# Patient Record
Sex: Male | Born: 1970 | Race: Black or African American | Hispanic: No | Marital: Married | Smoking: Never smoker
Health system: Southern US, Community
[De-identification: ages and names within clinical notes are randomized; demographics above are authoritative.]

## PROBLEM LIST (undated history)

## (undated) ENCOUNTER — Ambulatory Visit (HOSPITAL_COMMUNITY): Admission: EM | Payer: BLUE CROSS/BLUE SHIELD | Source: Home / Self Care

## (undated) DIAGNOSIS — B1920 Unspecified viral hepatitis C without hepatic coma: Secondary | ICD-10-CM

## (undated) DIAGNOSIS — I10 Essential (primary) hypertension: Secondary | ICD-10-CM

## (undated) DIAGNOSIS — B360 Pityriasis versicolor: Secondary | ICD-10-CM

## (undated) HISTORY — DX: Pityriasis versicolor: B36.0

---

## 2000-04-10 ENCOUNTER — Encounter: Payer: Self-pay | Admitting: General Practice

## 2000-04-10 ENCOUNTER — Encounter: Admission: RE | Admit: 2000-04-10 | Discharge: 2000-04-10 | Payer: Self-pay | Admitting: General Practice

## 2004-02-10 ENCOUNTER — Emergency Department (HOSPITAL_COMMUNITY): Admission: EM | Admit: 2004-02-10 | Discharge: 2004-02-10 | Payer: Self-pay

## 2009-10-13 ENCOUNTER — Emergency Department (HOSPITAL_COMMUNITY): Admission: EM | Admit: 2009-10-13 | Discharge: 2009-10-13 | Payer: Self-pay | Admitting: Emergency Medicine

## 2011-05-20 ENCOUNTER — Ambulatory Visit: Payer: Self-pay | Admitting: Physician Assistant

## 2011-05-20 VITALS — BP 111/78 | HR 62 | Temp 97.8°F | Resp 16 | Ht 72.75 in | Wt 192.8 lb

## 2011-05-20 DIAGNOSIS — IMO0001 Reserved for inherently not codable concepts without codable children: Secondary | ICD-10-CM

## 2011-05-20 DIAGNOSIS — R030 Elevated blood-pressure reading, without diagnosis of hypertension: Secondary | ICD-10-CM

## 2011-05-20 DIAGNOSIS — B360 Pityriasis versicolor: Secondary | ICD-10-CM

## 2011-05-20 DIAGNOSIS — R51 Headache: Secondary | ICD-10-CM

## 2011-05-20 DIAGNOSIS — B1910 Unspecified viral hepatitis B without hepatic coma: Secondary | ICD-10-CM

## 2011-05-20 DIAGNOSIS — G44209 Tension-type headache, unspecified, not intractable: Secondary | ICD-10-CM

## 2011-05-20 MED ORDER — MELOXICAM 15 MG PO TABS: 15.0000 mg | ORAL_TABLET | Freq: Every day | ORAL | Status: DC

## 2011-05-20 MED ORDER — KETOCONAZOLE 200 MG PO TABS: 200.0000 mg | ORAL_TABLET | Freq: Every day | ORAL | Status: DC

## 2011-05-20 NOTE — Patient Instructions (Signed)
Increase your water intake to 64 ounces each day.  Increase your sleep to 6-8 hours each day, even if it's in parts.  If your headaches continue, return for additional evaluation.

## 2011-05-21 ENCOUNTER — Encounter: Payer: Self-pay | Admitting: Physician Assistant

## 2011-05-21 DIAGNOSIS — B360 Pityriasis versicolor: Secondary | ICD-10-CM | POA: Insufficient documentation

## 2011-05-21 DIAGNOSIS — B1910 Unspecified viral hepatitis B without hepatic coma: Secondary | ICD-10-CM | POA: Insufficient documentation

## 2011-05-21 NOTE — Progress Notes (Signed)
  Subjective:    Patient ID: Bryan Marsh, male    DOB: [birth year], 41 y.o.   MRN: [id number]  HPI This patient presents complaining of HA for 3-4 days.  He describes the pain as a dull ache.  No associated symptoms:  Dizziness, vision change, nausea/vomiting, paresthesias, weakness.  He's been working long hours and going to school.  Most nights only getting 3-4 hours of sleep, often less.  He also complains of recurrent tinea versicolor every time he re-starts his exercise regimen.  He requests an Rx for ketoconazole in advance of his next efforts.  Tinea versicolor episodes are well documented in his chart.  He occasionally has pain in the left elbow.  Pain on the bottom of the right foot after wearing a new pair of shoes last week.  He has tried no treatments for his HA or foot or elbow pain.   Review of Systems As above.  No F/C.  No rash.      Objective:   Physical Exam  VS noted.  WDWNBM, awake, alert and oriented, NAD.  Sclera and conjunctiva are clear.  Fundi are normal.  TMs, nasal mucosa and OP are clear. Cranial nerves II-XII are intact.  Neck is supple, nontender without lymphadenopathy or thyromegaly.  Heart has a RRR without murmurs.  Lungs CTA.  Peripheral pulses are symmetric.  Skin is warm and dry.  Increased callous noted on the left elbow.  Non-tender.  Good strength in the upper and lower extremities.  DTRs are symmetric.  Sensation is intact.  Coordination is normal.       Assessment & Plan:  HA. Likely due to sleep deprivation.  Discussed ways to increase his sleep.  Meloxicam trial.  If symptoms worsen, persist or change, RTC.  Tinea Versicolor.  Ketoconazole refill.  Foot pain.  Meloxicam will likely help, resume wearing alternate shoes.  Elevated BP.  Monitor outside the office.  If consistently >140/90, start medication.

## 2011-06-22 ENCOUNTER — Ambulatory Visit: Payer: Self-pay | Admitting: Emergency Medicine

## 2011-06-22 ENCOUNTER — Encounter: Payer: Self-pay | Admitting: Emergency Medicine

## 2011-06-22 ENCOUNTER — Ambulatory Visit: Payer: Self-pay

## 2011-06-22 VITALS — BP 147/82 | HR 106 | Temp 101.3°F | Resp 16 | Ht 72.0 in | Wt 192.6 lb

## 2011-06-22 DIAGNOSIS — Z202 Contact with and (suspected) exposure to infections with a predominantly sexual mode of transmission: Secondary | ICD-10-CM

## 2011-06-22 DIAGNOSIS — IMO0001 Reserved for inherently not codable concepts without codable children: Secondary | ICD-10-CM

## 2011-06-22 DIAGNOSIS — R05 Cough: Secondary | ICD-10-CM

## 2011-06-22 DIAGNOSIS — R059 Cough, unspecified: Secondary | ICD-10-CM

## 2011-06-22 DIAGNOSIS — R5381 Other malaise: Secondary | ICD-10-CM

## 2011-06-22 DIAGNOSIS — R5383 Other fatigue: Secondary | ICD-10-CM

## 2011-06-22 DIAGNOSIS — R300 Dysuria: Secondary | ICD-10-CM

## 2011-06-22 DIAGNOSIS — R509 Fever, unspecified: Secondary | ICD-10-CM

## 2011-06-22 DIAGNOSIS — R52 Pain, unspecified: Secondary | ICD-10-CM

## 2011-06-22 LAB — POCT CBC
Granulocyte percent: 58.1 %G (ref 37–80)
HCT, POC: 44.9 % (ref 43.5–53.7)
Hemoglobin: 14.3 g/dL (ref 14.1–18.1)
Lymph, poc: 2.1 (ref 0.6–3.4)
MCH, POC: 29.9 pg (ref 27–31.2)
MCHC: 31.8 g/dL (ref 31.8–35.4)
MCV: 94 fL (ref 80–97)
MID (cbc): 0.7 (ref 0–0.9)
MPV: 10.7 fL (ref 0–99.8)
POC Granulocyte: 3.8 (ref 2–6.9)
POC LYMPH PERCENT: 31.6 %L (ref 10–50)
POC MID %: 10.3 %M (ref 0–12)
Platelet Count, POC: 155 10*3/uL (ref 142–424)
RBC: 4.78 M/uL (ref 4.69–6.13)
RDW, POC: 14.6 %
WBC: 6.5 10*3/uL (ref 4.6–10.2)

## 2011-06-22 LAB — POCT URINALYSIS DIPSTICK
Bilirubin, UA: NEGATIVE
Glucose, UA: NEGATIVE
Leukocytes, UA: NEGATIVE
Nitrite, UA: NEGATIVE
Protein, UA: 30
Spec Grav, UA: 1.025
Urobilinogen, UA: 0.2
pH, UA: 5.5

## 2011-06-22 LAB — POCT UA - MICROSCOPIC ONLY
Bacteria, U Microscopic: NEGATIVE
Casts, Ur, LPF, POC: NEGATIVE
Crystals, Ur, HPF, POC: NEGATIVE
Mucus, UA: NEGATIVE
Yeast, UA: NEGATIVE

## 2011-06-22 LAB — POCT INFLUENZA A/B
Influenza A, POC: NEGATIVE
Influenza B, POC: NEGATIVE

## 2011-06-22 MED ORDER — ACETAMINOPHEN 500 MG PO TABS
1000.0000 mg | ORAL_TABLET | Freq: Once | ORAL | Status: AC
  Administered 2011-06-22: 1000 mg via ORAL

## 2011-06-22 MED ORDER — METHOCARBAMOL 750 MG PO TABS: 750.0000 mg | ORAL_TABLET | Freq: Four times a day (QID) | ORAL | Status: AC
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
## 2011-06-23 LAB — GC/CHLAMYDIA PROBE AMP, GENITAL
Chlamydia, DNA Probe: NEGATIVE
GC Probe Amp, Genital: NEGATIVE

## 2011-06-24 ENCOUNTER — Telehealth: Payer: Self-pay

## 2014-02-16 ENCOUNTER — Encounter (HOSPITAL_COMMUNITY): Payer: Self-pay | Admitting: Emergency Medicine

## 2014-02-16 ENCOUNTER — Emergency Department (HOSPITAL_COMMUNITY)
Admission: EM | Admit: 2014-02-16 | Discharge: 2014-02-17 | Disposition: A | Discharge status: Home / Self Care | Payer: Self-pay | Attending: Emergency Medicine | Admitting: Emergency Medicine

## 2014-02-16 DIAGNOSIS — Z8619 Personal history of other infectious and parasitic diseases: Secondary | ICD-10-CM | POA: Insufficient documentation

## 2014-02-16 DIAGNOSIS — R112 Nausea with vomiting, unspecified: Secondary | ICD-10-CM

## 2014-02-16 DIAGNOSIS — R1084 Generalized abdominal pain: Secondary | ICD-10-CM

## 2014-02-16 HISTORY — DX: Unspecified viral hepatitis C without hepatic coma: B19.20

## 2014-02-16 LAB — COMPREHENSIVE METABOLIC PANEL
ALT: 22 U/L (ref 0–53)
AST: 20 U/L (ref 0–37)
Albumin: 3.7 g/dL (ref 3.5–5.2)
Alkaline Phosphatase: 48 U/L (ref 39–117)
Anion gap: 14 (ref 5–15)
BUN: 14 mg/dL (ref 6–23)
CALCIUM: 9.6 mg/dL (ref 8.4–10.5)
CO2: 24 meq/L (ref 19–32)
Chloride: 104 mEq/L (ref 96–112)
Creatinine, Ser: 0.87 mg/dL (ref 0.50–1.35)
GFR calc Af Amer: >90 mL/min (ref >90)
Glucose, Bld: 101 mg/dL — ABNORMAL HIGH (ref 70–99)
POTASSIUM: 4.2 meq/L (ref 3.7–5.3)
SODIUM: 142 meq/L (ref 137–147)
Total Bilirubin: <0.2 mg/dL — ABNORMAL LOW (ref 0.3–1.2)
Total Protein: 7.3 g/dL (ref 6.0–8.3)

## 2014-02-16 LAB — CBC WITH DIFFERENTIAL/PLATELET
BASOS ABS: 0 10*3/uL (ref 0.0–0.1)
Basophils Relative: 0 % (ref 0–1)
EOS PCT: 1 % (ref 0–5)
Eosinophils Absolute: 0.1 10*3/uL (ref 0.0–0.7)
HCT: 38.6 % — ABNORMAL LOW (ref 39.0–52.0)
Hemoglobin: 12.9 g/dL — ABNORMAL LOW (ref 13.0–17.0)
LYMPHS PCT: 43 % (ref 12–46)
Lymphs Abs: 3.1 10*3/uL (ref 0.7–4.0)
MCH: 30.2 pg (ref 26.0–34.0)
MCHC: 33.4 g/dL (ref 30.0–36.0)
MCV: 90.4 fL (ref 78.0–100.0)
Monocytes Absolute: 0.5 10*3/uL (ref 0.1–1.0)
Monocytes Relative: 7 % (ref 3–12)
NEUTROS ABS: 3.4 10*3/uL (ref 1.7–7.7)
NEUTROS PCT: 49 % (ref 43–77)
PLATELETS: 181 10*3/uL (ref 150–400)
RBC: 4.27 MIL/uL (ref 4.22–5.81)
RDW: 14.1 % (ref 11.5–15.5)
WBC: 7.1 10*3/uL (ref 4.0–10.5)

## 2014-02-16 LAB — LIPASE, BLOOD: Lipase: 19 U/L (ref 11–59)

## 2014-02-16 MED ORDER — IOHEXOL 300 MG/ML  SOLN: 25.0000 mL | Freq: Once | INTRAMUSCULAR | Status: DC | PRN

## 2014-02-16 MED ORDER — MORPHINE SULFATE 4 MG/ML IJ SOLN
4.0000 mg | Freq: Once | INTRAMUSCULAR | Status: AC
Start: 2014-02-16 — End: 2014-02-16
  Administered 2014-02-16: 4 mg via INTRAVENOUS
  Filled 2014-02-16: qty 1

## 2014-02-16 MED ORDER — ONDANSETRON HCL 4 MG/2ML IJ SOLN
4.0000 mg | Freq: Once | INTRAMUSCULAR | Status: AC
  Administered 2014-02-16: 4 mg via INTRAVENOUS
  Filled 2014-02-16: qty 2

## 2014-02-16 MED ORDER — SODIUM CHLORIDE 0.9 % IV BOLUS (SEPSIS)
1000.0000 mL | Freq: Once | INTRAVENOUS | Status: AC
  Administered 2014-02-16: 1000 mL via INTRAVENOUS

## 2014-02-16 MED ORDER — IOHEXOL 300 MG/ML  SOLN
25.0000 mL | Freq: Once | INTRAMUSCULAR | Status: AC | PRN
  Administered 2014-02-16: 25 mL via ORAL

## 2014-02-16 NOTE — ED Provider Notes (Signed)
CSN: [id number]     Arrival date & time 02/16/14  1955 History   First MD Initiated Contact with Patient 02/16/14 2229     Chief Complaint  Patient presents with  . Abdominal Pain   Bryan Marsh is a 43 y.o. male with history of hepatitis C who presents to the ED complaining of abdominal pain, nausea and vomiting after eating expired cut cantaloupe earlier today. Reports that he accidentally ingested some expired cut cantaloupe around 6:30 PM today. He reports that he started vomiting about an hour after he ingested. She reports his abdominal pain began after he started vomiting. He reports he had 3 episodes of diarrhea today. He reports he is feeling nauseated currently. He rates his pain at 7/10 sharp. He has attempted no treatments today. He denies previous abdominal surgeries. The patient denies fevers, chills, hematemesis, hematuria, hematochezia, chest pain, or shortness of breath.   (Consider location/radiation/quality/duration/timing/severity/associated sxs/prior Treatment) HPI  Past Medical History  Diagnosis Date  . Tinea versicolor   . Hepatitis C    History reviewed. No pertinent past surgical history. No family history on file. History  Substance Use Topics  . Smoking status: Never Smoker   . Smokeless tobacco: Never Used  . Alcohol Use: No    Review of Systems  Constitutional: Negative for fever and chills.  HENT: Negative for congestion, ear pain, sore throat and trouble swallowing.   Eyes: Negative for pain and visual disturbance.  Respiratory: Negative for cough, shortness of breath and wheezing.   Cardiovascular: Negative for chest pain and palpitations.  Gastrointestinal: Positive for nausea, vomiting, abdominal pain and diarrhea. Negative for blood in stool.  Genitourinary: Negative for urgency, frequency, hematuria, flank pain and difficulty urinating.  Musculoskeletal: Negative for back pain and neck pain.  Skin: Negative for rash.  Neurological: Negative  for dizziness, weakness, light-headedness and headaches.  All other systems reviewed and are negative.     Allergies  Review of patient's allergies indicates no known allergies.  Home Medications   Prior to Admission medications   Medication Sig Start Date End Date Taking? Authorizing Provider  acetaminophen (TYLENOL) 325 MG tablet Take 650 mg by mouth every 6 (six) hours as needed for mild pain.   Yes Historical Provider, MD  Multiple Vitamin (MULTIVITAMIN) tablet Take 1 tablet by mouth daily.   Yes Historical Provider, MD   BP 141/81 mmHg  Pulse 70  Temp(Src) 97.9 F (36.6 C)  Resp 18  Ht 6\' 2"  (1.88 m)  Wt 190 lb (86.183 kg)  BMI 24.38 kg/m2  SpO2 94% Physical Exam  Constitutional: He appears well-developed and well-nourished. No distress.  HENT:  Head: Normocephalic and atraumatic.  Mouth/Throat: Oropharynx is clear and moist.  Eyes: Conjunctivae are normal. Pupils are equal, round, and reactive to light. Right eye exhibits no discharge. Left eye exhibits no discharge.  Neck: Neck supple.  Cardiovascular: Normal rate, regular rhythm, normal heart sounds and intact distal pulses.  Exam reveals no gallop and no friction rub.   No murmur heard. Pulmonary/Chest: Effort normal and breath sounds normal. No respiratory distress. He has no wheezes. He has no rales.  Abdominal: Soft. Bowel sounds are normal. He exhibits no distension and no mass. There is tenderness. There is rebound and guarding.  Abdomen is soft. Patient has some mild right lower quadrant tenderness. Patient has moderate left lower quadrant tenderness. Positive obturator sign. Positive psoas sign. Negative rebound tenderness. Negative Murphy sign.  Musculoskeletal: He exhibits no edema.  Lymphadenopathy:  He has no cervical adenopathy.  Neurological: He is alert. Coordination normal.  Skin: Skin is warm and dry. No rash noted. He is not diaphoretic. No erythema. No pallor.  Psychiatric: He has a normal mood  and affect. His behavior is normal.  Nursing note and vitals reviewed.   ED Course  Procedures (including critical care time) Labs Review Labs Reviewed  CBC WITH DIFFERENTIAL - Abnormal; Notable for the following:    Hemoglobin 12.9 (*)    HCT 38.6 (*)    All other components within normal limits  COMPREHENSIVE METABOLIC PANEL - Abnormal; Notable for the following:    Glucose, Bld 101 (*)    Total Bilirubin <0.2 (*)    All other components within normal limits  LIPASE, BLOOD  URINALYSIS, ROUTINE W REFLEX MICROSCOPIC    Imaging Review No results found.   EKG Interpretation None      Filed Vitals:   02/16/14 2330 02/17/14 0000 02/17/14 0100 02/17/14 0115  BP: 137/90 130/90 139/97 141/81  Pulse: 63 77 74 70  Temp:      TempSrc:   Oral   Resp:   18   Height:      Weight:      SpO2: 97% 95% 96% 94%     MDM   Meds given in ED:  Medications  sodium chloride 0.9 % bolus 1,000 mL (1,000 mLs Intravenous New Bag/Given 02/16/14 2339)  morphine 4 MG/ML injection 4 mg (4 mg Intravenous Given 02/16/14 2342)  ondansetron (ZOFRAN) injection 4 mg (4 mg Intravenous Given 02/16/14 2340)  iohexol (OMNIPAQUE) 300 MG/ML solution 25 mL (25 mLs Oral Contrast Given 02/16/14 2357)  iohexol (OMNIPAQUE) 300 MG/ML solution 100 mL (100 mLs Intravenous Contrast Given 02/17/14 0126)    New Prescriptions   No medications on file    Final diagnoses:  Generalized abdominal pain  Non-intractable vomiting with nausea, vomiting of unspecified type   Bryan Marsh is a 43 y.o. male with history of hepatitis C who presents to the ED complaining of abdominal pain, nausea and vomiting after eating expired cut cantaloupe earlier today.  12:45 am Patient is resting in bed. The patient reports his pain has decreased to a 6 out of 10. The patient reports his nausea has resolved. He reports he has not had any vomiting or diarrhea since getting into the room. I advised him that we are still waiting for  his CT scan at this time. The patient verbalized understanding with plan. 2:02 AM patient is sleeping in bed. I had to awake patient to speak with him. Patient reports his abdominal pain is now a 5 out of 10 and is decreasing. Still waiting for CT scan results. Advised patient to follow up with his PCP. Patient provided resource list.   2:30 AM patient care handed off to Dr. Loretha StaplerWofford who will review CT scan and make disposition.   Patient discussed with Dr. Loretha StaplerWofford who agrees with assessment and plan.      Lawana ChambersWilliam Duncan Ersel Enslin, PA-C 02/17/14 [id number]  Warnell Foresterrey Wofford, MD 02/17/14 [phone]

## 2014-02-16 NOTE — ED Notes (Signed)
Pt. reports mid abdominal pain with emesis and diarrhea after eating cantaloupe this evening . Denies fever or chills.

## 2014-02-17 ENCOUNTER — Emergency Department (HOSPITAL_COMMUNITY): Payer: Self-pay

## 2014-02-17 ENCOUNTER — Encounter (HOSPITAL_COMMUNITY): Payer: Self-pay

## 2014-02-17 LAB — URINALYSIS, ROUTINE W REFLEX MICROSCOPIC
Bilirubin Urine: NEGATIVE
Glucose, UA: NEGATIVE mg/dL
HGB URINE DIPSTICK: NEGATIVE
Ketones, ur: NEGATIVE mg/dL
LEUKOCYTES UA: NEGATIVE
NITRITE: NEGATIVE
PROTEIN: NEGATIVE mg/dL
SPECIFIC GRAVITY, URINE: 1.009 (ref 1.005–1.030)
UROBILINOGEN UA: 0.2 mg/dL (ref 0.0–1.0)
pH: 5.5 (ref 5.0–8.0)

## 2014-02-17 MED ORDER — IOHEXOL 300 MG/ML  SOLN
100.0000 mL | Freq: Once | INTRAMUSCULAR | Status: AC | PRN
  Administered 2014-02-17: 100 mL via INTRAVENOUS

## 2014-02-17 MED ORDER — ONDANSETRON 4 MG PO TBDP: 4.0000 mg | ORAL_TABLET | Freq: Three times a day (TID) | ORAL | Status: AC | PRN

## 2014-02-17 NOTE — ED Notes (Signed)
Pt dc home per MD order, denies pain at this time, pt is stable. Denied wheelchair, pt assisted out to main lobby.

## 2014-02-17 NOTE — ED Provider Notes (Signed)
Medical screening examination/treatment/procedure(s) were conducted as a shared visit with non-physician practitioner(s) and myself.  I personally evaluated the patient during the encounter.   EKG Interpretation None      43 yo male presenting with vomiting and periumbilical abdominal pain which started a few hours ago after eating suspicious food.  On exam, well appearing, nontoxic, not distressed, normal respiratory effort, normal perfusion, abdomen soft and nontender, no R/R/G.  He states he feels much better now.  His CT scan was negative.  He appears stable for discharge home.    Clinical Impression: 1. Generalized abdominal pain   2. Non-intractable vomiting with nausea, vomiting of unspecified type       Warnell Foresterrey Gabriele Loveland, MD 02/17/14 [phone]

## 2014-02-17 NOTE — Discharge Instructions (Signed)
Abdominal Pain °Many things can cause abdominal pain. Usually, abdominal pain is not caused by a disease and will improve without treatment. It can often be observed and treated at home. Your health care provider will do a physical exam and possibly order blood tests and X-rays to help determine the seriousness of your pain. However, in many cases, more time must pass before a clear cause of the pain can be found. Before that point, your health care provider may not know if you need more testing or further treatment. °HOME CARE INSTRUCTIONS  °Monitor your abdominal pain for any changes. The following actions may help to alleviate any discomfort you are experiencing: °· Only take over-the-counter or prescription medicines as directed by your health care provider. °· Do not take laxatives unless directed to do so by your health care provider. °· Try a clear liquid diet (broth, tea, or water) as directed by your health care provider. Slowly move to a bland diet as tolerated. °SEEK MEDICAL CARE IF: °· You have unexplained abdominal pain. °· You have abdominal pain associated with nausea or diarrhea. °· You have pain when you urinate or have a bowel movement. °· You experience abdominal pain that wakes you in the night. °· You have abdominal pain that is worsened or improved by eating food. °· You have abdominal pain that is worsened with eating fatty foods. °· You have a fever. °SEEK IMMEDIATE MEDICAL CARE IF:  °· Your pain does not go away within 2 hours. °· You keep throwing up (vomiting). °· Your pain is felt only in portions of the abdomen, such as the right side or the left lower portion of the abdomen. °· You pass bloody or black tarry stools. °MAKE SURE YOU: °· Understand these instructions.   °· Will watch your condition.   °· Will get help right away if you are not doing well or get worse.   °Document Released: 12/20/2004 Document Revised: 03/17/2013 Document Reviewed: 11/19/2012 °ExitCare® Patient Information  ©2015 [employer]. This information is not intended to replace advice given to you by your health care provider. Make sure you discuss any questions you have with your health care provider. ° °Nausea and Vomiting °Nausea is a sick feeling that often comes before throwing up (vomiting). Vomiting is a reflex where stomach contents come out of your mouth. Vomiting can cause severe loss of body fluids (dehydration). Children and elderly adults can become dehydrated quickly, especially if they also have diarrhea. Nausea and vomiting are symptoms of a condition or disease. It is important to find the cause of your symptoms. °CAUSES  °· Direct irritation of the stomach lining. This irritation can result from increased acid production (gastroesophageal reflux disease), infection, food poisoning, taking certain medicines (such as nonsteroidal anti-inflammatory drugs), alcohol use, or tobacco use. °· Signals from the brain. These signals could be caused by a headache, heat exposure, an inner ear disturbance, increased pressure in the brain from injury, infection, a tumor, or a concussion, pain, emotional stimulus, or metabolic problems. °· An obstruction in the gastrointestinal tract (bowel obstruction). °· Illnesses such as diabetes, hepatitis, gallbladder problems, appendicitis, kidney problems, cancer, sepsis, atypical symptoms of a heart attack, or eating disorders. °· Medical treatments such as chemotherapy and radiation. °· Receiving medicine that makes you sleep (general anesthetic) during surgery. °DIAGNOSIS °Your caregiver may ask for tests to be done if the problems do not improve after a few days. Tests may also be done if symptoms are severe or if the reason for the nausea   and vomiting is not clear. Tests may include: °· Urine tests. °· Blood tests. °· Stool tests. °· Cultures (to look for evidence of infection). °· X-rays or other imaging studies. °Test results can help your caregiver make decisions about  treatment or the need for additional tests. °TREATMENT °You need to stay well hydrated. Drink frequently but in small amounts. You may wish to drink water, sports drinks, clear broth, or eat frozen ice pops or gelatin dessert to help stay hydrated. When you eat, eating slowly may help prevent nausea. There are also some antinausea medicines that may help prevent nausea. °HOME CARE INSTRUCTIONS  °· Take all medicine as directed by your caregiver. °· If you do not have an appetite, do not force yourself to eat. However, you must continue to drink fluids. °· If you have an appetite, eat a normal diet unless your caregiver tells you differently. °¨ Eat a variety of complex carbohydrates (rice, wheat, potatoes, bread), lean meats, yogurt, fruits, and vegetables. °¨ Avoid high-fat foods because they are more difficult to digest. °· Drink enough water and fluids to keep your urine clear or pale yellow. °· If you are dehydrated, ask your caregiver for specific rehydration instructions. Signs of dehydration may include: °¨ Severe thirst. °¨ Dry lips and mouth. °¨ Dizziness. °¨ Dark urine. °¨ Decreasing urine frequency and amount. °¨ Confusion. °¨ Rapid breathing or pulse. °SEEK IMMEDIATE MEDICAL CARE IF:  °· You have blood or brown flecks (like coffee grounds) in your vomit. °· You have black or bloody stools. °· You have a severe headache or stiff neck. °· You are confused. °· You have severe abdominal pain. °· You have chest pain or trouble breathing. °· You do not urinate at least once every 8 hours. °· You develop cold or clammy skin. °· You continue to vomit for longer than 24 to 48 hours. °· You have a fever. °MAKE SURE YOU:  °· Understand these instructions. °· Will watch your condition. °· Will get help right away if you are not doing well or get worse. °Document Released: 03/12/2005 Document Revised: 06/04/2011 Document Reviewed: 08/09/2010 °ExitCare® Patient Information ©2015 [employer]. This information is not  intended to replace advice given to you by your health care provider. Make sure you discuss any questions you have with your health care provider. ° ° °Emergency Department Resource Guide °1) Find a Doctor and Pay Out of Pocket °Although you won't have to find out who is covered by your insurance plan, it is a good idea to ask around and get recommendations. You will then need to call the office and see if the doctor you have chosen will accept you as a new patient and what types of options they offer for patients who are self-pay. Some doctors offer discounts or will set up payment plans for their patients who do not have insurance, but you will need to ask so you aren't surprised when you get to your appointment. ° °2) Contact Your Local Health Department °Not all health departments have doctors that can see patients for sick visits, but many do, so it is worth a call to see if yours does. If you don't know where your local health department is, you can check in your phone book. The CDC also has a tool to help you locate your state's health department, and many state websites also have listings of all of their local health departments. ° °3) Find a Walk-in Clinic °If your illness is not likely to be very severe   or complicated, you may want to try a walk in clinic. These are popping up all over the country in pharmacies, drugstores, and shopping centers. They're usually staffed by nurse practitioners or physician assistants that have been trained to treat common illnesses and complaints. They're usually fairly quick and inexpensive. However, if you have serious medical issues or chronic medical problems, these are probably not your best option. ° °No Primary Care Doctor: °- Call [facility] at  832-8000 - they can help you locate a primary care doctor that  accepts your insurance, provides certain services, etc. °- Physician Referral Service- 1-800-533-3463 ° °Chronic Pain Problems: °Organization          Address  Phone   Notes  °[facility]  (336) 297-2271 Patients need to be referred by their primary care doctor.  ° °Medication Assistance: °Organization         Address  Phone   Notes  °[place] [facility] [address] °[place], [place] [address] (336) 641-8030 --Must be a resident of [place] °-- Must have NO insurance coverage whatsoever (no Medicaid/ Medicare, etc.) °-- The pt. MUST have a primary care doctor that directs their care regularly and follows them in the community °  °[facility]  (866) 331-1348   °[employer]  (888) 892-1162   ° °Agencies that provide inexpensive medical care: °Organization         Address  Phone   Notes  °[facility]  (336) 832-8035   °[facility]    (336) 832-7272   °[facility] [address] °[place], [place] [address] (336) 832-4777   °[facility] [address], °[place] (336) 271-4999   °[facility]    (336) 373-0678   °[facility]    (336) 272-1050   °[facility] ° [address], [place] Phone:  (336) 832-4444, Fax:  (336) 832-4440 Hours of Operation:  9 am - 6 pm, M-F.  Also accepts Medicaid/Medicare and self-pay.  °[facility] ° [address], [place] Phone: (336) 832-3150, Fax: (336) [phone]. Hours of Operation:  8:30 am - 5:30 pm, M-F.  Also accepts Medicaid and self-pay.  °[facility] [place] [address], [place] Phone: (336) 878-6027   °[facility] [address], [place], [place] (336)723-1848, Ext. 123 Mondays & Thursdays: 7-9 AM.  First 15 patients are seen on a first come, first serve basis. °  ° °Medicaid-accepting [place] Providers: ° °Organization         Address  Phone   Notes  °[facility] [address], [place] (336) 641-2100 Also accepts self-pay patients.  °[facility] [address], [place] ° (336) 856-9996   °[facility] [address], [place] (336) 288-8857   °[facility] [address] [place] [address], [place] (336) 299-7000   °Veita Bland [address], [place]  ° (336) 373-1557 Only accepts [place] Access Medicaid patients after they have their name applied to their card.  ° °Self-Pay (no insurance) in [place]: ° °Organization         Address  Phone   Notes  °Sickle Cell Patients, [facility] [address], [place] (336) 832-1970   °[facility] [address], [place] (336) 832-4400   °[facility] ° [address], [place] (336) 992-4800   °[facility]/Dr. Osei-Bonsu ° [address] [place] [address], [place] or [address], [place] (336) 841-8500 Phone number for both [place] and [place] locations is the same.  °[facility] [address], [place] (336) 299-0000   °[facility] [address] [place] [address], [place] or [address] (336) 852-7530 °(336) 878-2260   °[facility] [address], [place] (336) 350-1642, phone; (336) 294-5005, fax Sees patients 1st and 3rd Saturday of every month.  Must not qualify for public or private insurance (i.e. Medicaid, Medicare, [place] Health Choice, Veterans' Benefits) • Household income should be no more than 200% of the poverty level •The clinic cannot treat you if you are pregnant or think you are pregnant • Sexually transmitted diseases are not treated at the clinic.  ° ° °Dental Care: °Organization         Address  Phone  Notes  °[facility] [address], [place] (336) 641-6152 Accepts children up to age 21 who are enrolled in Medicaid or [place] Health Choice; pregnant women with a Medicaid card; and  children who have applied for Medicaid or [place] Health Choice, but were declined, whose parents can pay a reduced fee at time of service.  °[facility] [place]  [address], [place] (336) 641-7733 Accepts children up to age 21 who are enrolled in Medicaid or [place] Health Choice; pregnant women with a Medicaid card; and children who have applied for Medicaid or [place] Health Choice, but were declined, whose parents can pay a reduced fee at time of service.  °[facility] ° [address], [place] (336) 641-4533 Patients are seen by appointment only. Walk-ins are not accepted. [facility] will see patients 18 years of age and older. °Monday - Tuesday (8am-5pm) °Most Wednesdays (8:30-5pm) °$30 per visit, cash only  °[facility] ° [address], [place] (336) 641-4533 Patients are seen by appointment only. Walk-ins are not accepted. [facility] will see patients 18 years of age and older. °One Wednesday Evening (Monthly: Volunteer Based).  $30 per visit, cash only  °[facility]  (919) 537-3737 for adults; Children under age 4, call Graduate Pediatric Dentistry at ([phone]) [phone]. Children aged 4-14, please call ([phone]) [phone] to request a pediatric application. ° Dental services are provided in all areas of dental care including fillings, crowns and bridges, complete and partial dentures, implants, gum treatment, root canals, and extractions. Preventive care is also provided. Treatment is provided to both adults and children. °Patients are selected via a lottery and there is often a waiting list. °  °[facility] [address], °[place] ° (336) 763-8833 [email or web address] °  °[facility] [address], [place], [place] (336)723-1848, Ext. 123 Second and Fourth Thursday of each month, opens at 6:30 AM; Clinic ends at 9 AM.  Patients are seen on a first-come first-served  basis, and a limited number are seen during each clinic.  ° °[facility] ° [address], [place], [place] (336) 723-7904   Eligibility Requirements °You must have lived in [place], [place], or [place] counties for at least the last three months. °  You cannot   be eligible for state or federal sponsored healthcare insurance, including Veterans Administration, Medicaid, or Medicare. °  You generally cannot be eligible for healthcare insurance through your employer.  °  How to apply: °Eligibility screenings are held every Tuesday and Wednesday afternoon from 1:00 pm until 4:00 pm. You do not need an appointment for the interview!  °[facility] [address], [place], [place] 336-631-2330   °[facility]  336-342-8273   °[facility]  336-703-3100   °[facility]  336-570-6415   ° °Behavioral Health Resources in the Community: °Intensive Outpatient Programs °Organization         Address  Phone  Notes  °[facility] [address], [place], [place] 336-878-6098   °[facility] [address], [place], [place] 336-832-9800   °[facility] [address], [place], [place] ° 336-882-2125   °[facility] [address],  °[place], [place] 1-800-853-5163 or 336-641-4981   °Substance Abuse Resources °Organization         Address  Phone  Notes  °[facility]  336-882-2125   °[facility]  336-784-9470   °[facility]  336-285-9073   °[facility]  336-845-3988   °Residential & Outpatient Substance Abuse Program  1-800-659-3381   °Psychological Services °Organization         Address  Phone  Notes  °[facility]  336- 832-9600   °[facility]  336- 378-7881   °[facility] [address], [place] 1-800-853-5163 or 336-641-4981   ° °Mobile Crisis Teams °Organization          Address  Phone  Notes  °[facility], Mobile Crisis Care Unit  1-877-626-1772   °Assertive °[facility] ° [address] [place], [place] 336-834-9664   °Sharon DeEsch [address] °[place] 336-554-5454   ° °Self-Help/Support Groups °Organization         Address  Phone             Notes  °[facility] - variety of support groups  336- 373-1402 Call for more information  °Narcotics Anonymous (NA), [facility] [address], °[place]  2 meetings at this location  ° °Residential Treatment Programs °Organization         Address  Phone  Notes  °[facility] [address],    °[place]  1-866-801-8205   °[facility] ° [address], [place], [place] 704-293-8524   °[facility] [address], [place] 336-845-3988 Admissions: 8am-3pm M-F  °[facility] [address]    °[place], [place] 336-841-1104   °[facility] [address], [place], [place] 336-379-7146   °[facility] [address]  °[place], [place] 336-285-9073   °[facility] - Intensive Outpatient [address], [place], [place] 336-852-3033   °[facility] [address]  °[place], [place] 1-877-615-2722 or 336-784-9470   °[facility] [address] [place],  336-227-7417 Accepts Medicaid  °[facility] [address]  °[place]  1-800-659-3381 Substance Abuse/Addiction Treatment  ° °[place] Behavioral Health Resources °Organization         Address  Phone  Notes  °[facility]  (888) 581-9988   °Julie Brannon, PhD [address]   [place], [place]   (336) 349-5553 or (336) 951-0000   °[facility]   [address] °[place], [place] (336) 349-4454   °[facility] [address], [place], [place] (336) 342-8316 Insurance/Medicaid/sponsorship  through [facility]  °[facility] [address]                                    [place], [place] (336) 342-8316 Therapy/tele-psych/case  °[facility] [address]  ° [place], [place] (336) 349-2233    °Dr. Arfeen  (336) 349-4544   °[facility] [place]  [employer] [place] [facility] 1) [address], [place] °2) [address], [place] °3)  [address], [place] (336) 349-3220 °(336) 342-7768 ° °(336) 342-8140   °[place] [facility] (336) 342-1394 or (336) 342-3537 (After Hours)    ° ° ° °

## 2014-07-23 ENCOUNTER — Telehealth: Payer: Self-pay

## 2014-07-23 NOTE — Telephone Encounter (Signed)
Representative from [employer] is calling in regards to a request for medical records. They sent a fax on the 26 and will be sending another faxed request today.

## 2015-05-22 ENCOUNTER — Emergency Department (HOSPITAL_COMMUNITY): Payer: BLUE CROSS/BLUE SHIELD

## 2015-05-22 ENCOUNTER — Encounter (HOSPITAL_COMMUNITY): Payer: Self-pay | Admitting: Emergency Medicine

## 2015-05-22 ENCOUNTER — Emergency Department (HOSPITAL_COMMUNITY)
Admission: EM | Admit: 2015-05-22 | Discharge: 2015-05-22 | Disposition: A | Discharge status: Home / Self Care | Payer: BLUE CROSS/BLUE SHIELD | Attending: Emergency Medicine | Admitting: Emergency Medicine

## 2015-05-22 DIAGNOSIS — Z8619 Personal history of other infectious and parasitic diseases: Secondary | ICD-10-CM | POA: Insufficient documentation

## 2015-05-22 DIAGNOSIS — R000 Tachycardia, unspecified: Secondary | ICD-10-CM | POA: Insufficient documentation

## 2015-05-22 DIAGNOSIS — Z79899 Other long term (current) drug therapy: Secondary | ICD-10-CM | POA: Insufficient documentation

## 2015-05-22 DIAGNOSIS — H9201 Otalgia, right ear: Secondary | ICD-10-CM | POA: Insufficient documentation

## 2015-05-22 DIAGNOSIS — R6889 Other general symptoms and signs: Secondary | ICD-10-CM

## 2015-05-22 DIAGNOSIS — R05 Cough: Secondary | ICD-10-CM | POA: Insufficient documentation

## 2015-05-22 DIAGNOSIS — R509 Fever, unspecified: Secondary | ICD-10-CM | POA: Diagnosis present

## 2015-05-22 LAB — BASIC METABOLIC PANEL
Anion gap: 13 (ref 5–15)
BUN: 9 mg/dL (ref 6–20)
CALCIUM: 9.4 mg/dL (ref 8.9–10.3)
CO2: 22 mmol/L (ref 22–32)
Chloride: 104 mmol/L (ref 101–111)
Creatinine, Ser: 1.02 mg/dL (ref 0.61–1.24)
GFR calc Af Amer: >60 mL/min (ref >60)
GFR calc non Af Amer: >60 mL/min (ref >60)
Glucose, Bld: 95 mg/dL (ref 65–99)
Potassium: 3.7 mmol/L (ref 3.5–5.1)
Sodium: 139 mmol/L (ref 135–145)

## 2015-05-22 LAB — CBC
HCT: 41.1 % (ref 39.0–52.0)
Hemoglobin: 14.1 g/dL (ref 13.0–17.0)
MCH: 31.4 pg (ref 26.0–34.0)
MCHC: 34.3 g/dL (ref 30.0–36.0)
MCV: 91.5 fL (ref 78.0–100.0)
Platelets: 142 10*3/uL — ABNORMAL LOW (ref 150–400)
RBC: 4.49 MIL/uL (ref 4.22–5.81)
RDW: 14 % (ref 11.5–15.5)
WBC: 8.2 10*3/uL (ref 4.0–10.5)

## 2015-05-22 LAB — I-STAT TROPONIN, ED: Troponin i, poc: 0 ng/mL (ref 0.00–0.08)

## 2015-05-22 MED ORDER — IBUPROFEN 800 MG PO TABS: 800.0000 mg | ORAL_TABLET | Freq: Three times a day (TID) | ORAL | Status: DC

## 2015-05-22 MED ORDER — BENZONATATE 100 MG PO CAPS: 100.0000 mg | ORAL_CAPSULE | Freq: Three times a day (TID) | ORAL | Status: AC

## 2015-05-22 MED ORDER — ACETAMINOPHEN 325 MG PO TABS
650.0000 mg | ORAL_TABLET | Freq: Once | ORAL | Status: AC
  Administered 2015-05-22: 650 mg via ORAL
  Filled 2015-05-22: qty 2

## 2015-05-22 MED ORDER — ONDANSETRON 4 MG PO TBDP: 4.0000 mg | ORAL_TABLET | Freq: Three times a day (TID) | ORAL | Status: AC | PRN

## 2015-05-22 NOTE — ED Notes (Signed)
Pt from home with c/o fever, cough and ear aches starting yesterday.  Denies N/V/D.  NAD, A&O

## 2015-05-22 NOTE — ED Provider Notes (Signed)
CSN: [id number]     Arrival date & time 05/22/15  1307 History   First MD Initiated Contact with Patient 05/22/15 1436     Chief Complaint  Patient presents with  . Flu Like Symptoms      (Consider location/radiation/quality/duration/timing/severity/associated sxs/prior Treatment) HPI   Bryan Marsh is a 45 y.o. male, patient with a history of hepatitis C, presenting to the ED with subjective fever, productive cough, and right ear pain since yesterday. Pt states the ear pain has since resolved. Pt has tried OTC cold medicines and a dose of Amoxicillin he had at home, both with no relief. Fever is been controlled with Tylenol at home. Pt denies N/V/D, abdominal pain, shortness of breath, headache, chest pain, or any other complaints.    Past Medical History  Diagnosis Date  . Tinea versicolor   . Hepatitis C    History reviewed. No pertinent past surgical history. History reviewed. No pertinent family history. Social History  Substance Use Topics  . Smoking status: Never Smoker   . Smokeless tobacco: Never Used  . Alcohol Use: No    Review of Systems  Constitutional: Positive for fever and chills. Negative for diaphoresis.  Respiratory: Positive for cough. Negative for shortness of breath.   Cardiovascular: Negative for chest pain.  Gastrointestinal: Negative for nausea, vomiting, abdominal pain and diarrhea.  Genitourinary: Negative for dysuria and hematuria.  Musculoskeletal: Negative for myalgias.  Skin: Negative for color change and pallor.  Neurological: Negative for headaches.  All other systems reviewed and are negative.     Allergies  Review of patient's allergies indicates no known allergies.  Home Medications   Prior to Admission medications   Medication Sig Start Date End Date Taking? Authorizing Provider  acetaminophen (TYLENOL) 325 MG tablet Take 650 mg by mouth every 6 (six) hours as needed for mild pain.    Historical Provider, MD  benzonatate  (TESSALON) 100 MG capsule Take 1 capsule (100 mg total) by mouth every 8 (eight) hours. 05/22/15   Shawn C Joy, PA-C  ibuprofen (ADVIL,MOTRIN) 800 MG tablet Take 1 tablet (800 mg total) by mouth 3 (three) times daily. 05/22/15   Shawn C Joy, PA-C  Multiple Vitamin (MULTIVITAMIN) tablet Take 1 tablet by mouth daily.    Historical Provider, MD  ondansetron (ZOFRAN ODT) 4 MG disintegrating tablet Take 1 tablet (4 mg total) by mouth every 8 (eight) hours as needed for nausea or vomiting. 02/17/14   Everlene Farrier, PA-C  ondansetron (ZOFRAN ODT) 4 MG disintegrating tablet Take 1 tablet (4 mg total) by mouth every 8 (eight) hours as needed for nausea or vomiting. 05/22/15   Shawn C Joy, PA-C   BP 146/96 mmHg  Pulse 108  Temp(Src) 100.5 F (38.1 C) (Oral)  Resp 20  SpO2 99% Physical Exam  Constitutional: He appears well-developed and well-nourished. No distress.  HENT:  Head: Normocephalic and atraumatic.  Right Ear: Tympanic membrane, external ear and ear canal normal.  Left Ear: Tympanic membrane, external ear and ear canal normal.  Mouth/Throat: Uvula is midline, oropharynx is clear and moist and mucous membranes are normal.  Eyes: Conjunctivae are normal. Pupils are equal, round, and reactive to light.  Neck: Normal range of motion. Neck supple.  Cardiovascular: Regular rhythm, normal heart sounds and intact distal pulses.  Tachycardia present.   Pulmonary/Chest: Effort normal and breath sounds normal. No respiratory distress.  No increased work of breathing. Patient speaks in full sentences without difficulty.  Abdominal: Soft. Bowel sounds are normal.  There is no tenderness. There is no guarding.  Musculoskeletal: He exhibits no edema or tenderness.  Lymphadenopathy:    He has no cervical adenopathy.  Neurological: He is alert.  Skin: Skin is warm and dry. He is not diaphoretic.  Nursing note and vitals reviewed.   ED Course  Procedures (including critical care time) Labs Review Labs  Reviewed  CBC - Abnormal; Notable for the following:    Platelets 142 (*)    All other components within normal limits  BASIC METABOLIC PANEL  I-STAT TROPOININ, ED    Imaging Review Dg Chest 2 View  05/22/2015  CLINICAL DATA:  Patient with fever, cough and earaches. EXAM: CHEST  2 VIEW COMPARISON:  Chest radiograph 06/22/2011. FINDINGS: The heart size and mediastinal contours are within normal limits. Both lungs are clear. The visualized skeletal structures are unremarkable. IMPRESSION: No active cardiopulmonary disease. Electronically Signed   By: Annia Belt M.D.   On: 05/22/2015 14:09   I have personally reviewed and evaluated these images and lab results as part of my medical decision-making.   EKG Interpretation None      MDM   Final diagnoses:  Flu-like symptoms    Bryan Marsh presents with flulike symptoms since yesterday.  Patient's presentation is consistent with a viral illness, such as influenza or an upper respiratory infection. Patient is nontoxic appearing, not tachypneic on my exam, maintains adequate SPO2 on room air, and is in no apparent distress. No abnormalities on the patient's labs or x-ray. Patient was given Tylenol for his fever prior to discharge. The patient was given instructions for home care as well as return precautions. Patient voices understanding of these instructions, accepts the plan, and is comfortable with discharge. Patient appears safe for discharge at this time.  Filed Vitals:   05/22/15 1325 05/22/15 1505  BP: 153/110 146/96  Pulse: 113 108  Temp: 98.8 F (37.1 C) 100.5 F (38.1 C)  TempSrc: Oral Oral  Resp: 22 20  SpO2: 96% 99%     Anselm Pancoast, PA-C 05/22/15 1521  Bethann Berkshire, MD 05/22/15 1555

## 2015-05-22 NOTE — Discharge Instructions (Signed)
You have been seen today for flulike symptoms. Your imaging and lab tests showed no abnormalities. Your symptoms are consistent with a viral illness. Viruses do not require antibiotics. Treatment is symptomatic care. Drink plenty of fluids and get plenty of rest. Ibuprofen or Tylenol for pain or fever. Zofran for nausea. Tessalon for cough. Follow up with PCP as needed. Return to ED should symptoms worsen.   Emergency Department Resource Guide 1) Find a Doctor and Pay Out of Pocket Although you won't have to find out who is covered by your insurance plan, it is a good idea to ask around and get recommendations. You will then need to call the office and see if the doctor you have chosen will accept you as a new patient and what types of options they offer for patients who are self-pay. Some doctors offer discounts or will set up payment plans for their patients who do not have insurance, but you will need to ask so you aren't surprised when you get to your appointment.  2) Contact Your Local Health Department Not all health departments have doctors that can see patients for sick visits, but many do, so it is worth a call to see if yours does. If you don't know where your local health department is, you can check in your phone book. The CDC also has a tool to help you locate your state's health department, and many state websites also have listings of all of their local health departments.  3) Find a Walk-in Clinic If your illness is not likely to be very severe or complicated, you may want to try a walk in clinic. These are popping up all over the country in pharmacies, drugstores, and shopping centers. They're usually staffed by nurse practitioners or physician assistants that have been trained to treat common illnesses and complaints. They're usually fairly quick and inexpensive. However, if you have serious medical issues or chronic medical problems, these are probably not your best option.  No  Primary Care Doctor: - Call Health Connect at  [phone] - they can help you locate a primary care doctor that  accepts your insurance, provides certain services, etc. - Physician Referral Service- [phone]  Chronic Pain Problems: Organization         Address  Phone   Notes  [facility]  [phone] Patients need to be referred by their primary care doctor.   Medication Assistance: Organization         Address  Phone   Notes  [facility] Medication [facility] [address] [place], [place] [address] [phone] --Must be a resident of [facility] -- Must have NO insurance coverage whatsoever (no Medicaid/ Medicare, etc.) -- The pt. MUST have a primary care doctor that directs their care regularly and follows them in the community   [facility]  [phone]   [employer]  [phone]    Agencies that provide inexpensive medical care: Organization         Address  Phone   Notes  [facility]  [phone]   [facility]    [phone]   [facility] [address] [place], [place] [address] [phone]   [facility] [address] [place], [place] [phone]   Planned Parenthood    [phone]   [facility]    [phone]   [facility]  [phone]  [address], [place] Phone:  [phone], Fax:  [phone] Hours of Operation:  9 am - 6 pm, M-F.  Also accepts Medicaid/Medicare and self-pay.  [facility] for [place], [address], [place] Phone: [phone], Fax: [phone]. Hours of Operation:  8:30 am - 5:30 pm, M-F.  Also accepts Medicaid and self-pay.  [facility] [address] [place] Phone: [phone]   [place], [place], [place] [phone], Ext. [phone] Mondays & Thursdays: 7-9 AM.  First 15 patients are seen on  a first come, first serve basis.    [place] Providers:  Organization         Address  Phone   Notes  [facility] [address],  [phone] Also accepts self-pay patients.  [facility] [id number] [place], [place]  [phone]   [place], [address], [place] [phone]   [facility] [address], [place] [phone]   Lucianne Lei [address], [place]   [phone] Only accepts [place] Access [place] patients after they have their name applied to their card.   Self-Pay (no insurance) in [facility]:  Organization         Address  Phone   Notes  Sickle Cell Patients, [facility] Internal Medicine [place] [phone]   [facility] [place] [phone]   [facility] [address]  [place], [place],  [phone]   [facility]/Dr. Osei-Bonsu  [address] [place] or [address], [place] [phone] Phone number for both [place] and [place] locations is the same.  [facility] [address], [place] [phone]   [facility] [address], [place] or [address] [phone]   [facility] [address] [place] [phone], phone; [phone], fax Sees patients 1st and 3rd Saturday of every month.  Must not qualify for public or private insurance (i.e. Medicaid, Medicare, [place] Health Choice, Veterans' Benefits)  Household income should be no more than 200% of the poverty level The clinic cannot treat you if you are pregnant or think you are pregnant  Sexually transmitted diseases are not treated at the clinic.    Dental Care: Organization          Address  Phone  Notes  [facility] Department of [facility] [place] [phone] Accepts children up to age 43 who are enrolled in [place] or [place]; pregnant women with a Medicaid card; and children who have applied for Medicaid or [place] Health Choice, but were declined, whose parents can pay a reduced fee at time of service.  [facility] Department of [facility]  [address], [place] [phone] Accepts children up to age 69 who are enrolled in [place] or [place]; pregnant women with a Medicaid card; and children who have applied for Medicaid or  Health Choice, but were declined, whose parents can pay a reduced fee at time of service.  [facility] Adult Dental Access PROGRAM  [place], [place] [phone] Patients  are seen by appointment only. Walk-ins are not accepted. [place] will see patients 52 years of age and older. Monday - Tuesday (8am-5pm) Most Wednesdays (8:30-5pm) $30 per visit, cash only  [facility] Adult Dental Access PROGRAM  [address], [facility] [phone] Patients are seen by appointment only. Walk-ins are not accepted. [place] will see patients 31 years of age and older. One Wednesday Evening (Monthly: Volunteer Based).  $30 per visit, cash only  [place]  [phone] for adults; Children under age 35, call Graduate Pediatric Dentistry at [phone]. Children aged 54-14, please call [phone] to request a pediatric application.  Dental services are provided in all areas of dental care including fillings, crowns and bridges, complete and partial dentures, implants, gum treatment, root canals, and extractions. Preventive care is also provided. Treatment is provided to both adults and children. Patients are selected via a lottery and there is often a waiting list.   [facility] [address], [place]  [phone] [email or web address]   [facility] [address] [place], [place] [phone], Ext. [phone] Second and Fourth Thursday of each month, opens at 6:30 AM; Clinic ends at 9 AM.  Patients are seen on a first-come first-served basis, and a limited number are seen during each clinic.   [facility]  [address] Hillard Danker [place], [place] [phone]   Eligibility Requirements You must have lived in [place], [place], or [place] counties for at least the last three months.   You cannot be eligible for state or federal sponsored [employer], including [employer], [place], or [employer].   You generally cannot be eligible for healthcare insurance through your employer.    How to apply: Eligibility screenings are held every Tuesday and Wednesday afternoon from 1:00 pm until 4:00 pm. You do not need an appointment for the interview!  [facility] [address], [place], [place]   [place]  [place] Department  [place]  [phone]    Behavioral Health Resources in the Community: Intensive Outpatient Programs Organization         Address  Phone  Notes  Esmeralda Morehouse. [address], [place], [place] [phone]   [facility] Outpatient [address] [place], [place]   [facility]: [facility] [address] [place], [place]   [place] [address],  [place], [place] or [phone]   Substance Abuse Resources Organization         Address  Phone  Notes  Alcohol and Drug Services  [phone]   [place]  [phone]   [facility] [place]   Chinita Pester  [phone]   Residential & Outpatient Substance Abuse Program  [phone]   Psychological  Services Organization         Address  Phone  Notes  [facility] [place]  [place]  [phone]   [place] [address] [place] or [phone]    Mobile Crisis Teams Organization         Address  Phone  Notes  [facility], Mobile Crisis Care Unit  [phone]   [facility]  [address] [place], [place]   [place] [address], [place] 18  [place] [phone]    Self-Help/Support Groups Organization         Address  Phone             Notes  [facility] [place] - variety of support groups  [place] Call for more information  Narcotics Anonymous (NA), [facility] [address], [employer] [place]  2 meetings at this location   Special educational needs teacher         Address  Phone  Notes  [facility] [place],    [place]  [phone]   [facility]  [address], [place] [phone], [place], [place]   [place], [place] [phone] Admissions: 8am-3pm M-F  Incentives Substance [place] [address],    [place], [place] [phone]   [facility] [address] [place], [place], [place]   [facility] [address],  [place], [place]   [facility] - Intensive Outpatient [address] Kristeen [address], [place], [place]   [facility] ([place] [place].,  [place], [place] [phone] or [phone]   [facility] ([facility]) [address] [place], [place] Accepts Medicaid  [facility] [place] [address],  [place] [phone] Substance Abuse/Addiction Treatment   [facility] Organization         Address  Phone  Notes  [facility]  [phone]   Domenic Schwab, PhD [address] Arlis Porta [place], [place]   [phone] or [phone]   [place], [place] [phone]   [facility] [address], [place], [place] [phone] Insurance/Medicaid/sponsorship through [facility] and Families [address] [place]                                    [place], [place] [phone] [place] [address], [place] [phone]    Dr. Adele Schilder  [phone]   [facility] Dept. 1) [address], [place] 2) [place] 3)  [place] [address], [place] [phone]  [phone]   [place] [phone] or [phone] (After Hours)

## 2015-08-09 ENCOUNTER — Other Ambulatory Visit: Payer: Self-pay | Admitting: Gastroenterology

## 2015-08-09 DIAGNOSIS — B1910 Unspecified viral hepatitis B without hepatic coma: Secondary | ICD-10-CM

## 2015-08-15 ENCOUNTER — Ambulatory Visit
Admission: RE | Admit: 2015-08-15 | Discharge: 2015-08-15 | Disposition: A | Discharge status: Home / Self Care | Payer: BLUE CROSS/BLUE SHIELD | Source: Ambulatory Visit | Attending: Gastroenterology | Admitting: Gastroenterology

## 2015-08-15 DIAGNOSIS — B1910 Unspecified viral hepatitis B without hepatic coma: Secondary | ICD-10-CM

## 2015-08-24 ENCOUNTER — Other Ambulatory Visit (HOSPITAL_COMMUNITY): Payer: Self-pay | Admitting: Gastroenterology

## 2015-08-24 DIAGNOSIS — B1910 Unspecified viral hepatitis B without hepatic coma: Secondary | ICD-10-CM

## 2015-09-20 ENCOUNTER — Ambulatory Visit (HOSPITAL_COMMUNITY)
Admission: RE | Admit: 2015-09-20 | Discharge: 2015-09-20 | Disposition: A | Discharge status: Home / Self Care | Payer: BLUE CROSS/BLUE SHIELD | Source: Ambulatory Visit | Attending: Gastroenterology | Admitting: Gastroenterology

## 2015-09-20 DIAGNOSIS — B1910 Unspecified viral hepatitis B without hepatic coma: Secondary | ICD-10-CM | POA: Diagnosis not present

## 2016-09-28 ENCOUNTER — Other Ambulatory Visit: Payer: Self-pay | Admitting: Gastroenterology

## 2016-09-28 DIAGNOSIS — R768 Other specified abnormal immunological findings in serum: Secondary | ICD-10-CM

## 2016-11-09 ENCOUNTER — Ambulatory Visit
Admission: RE | Admit: 2016-11-09 | Discharge: 2016-11-09 | Disposition: A | Discharge status: Home / Self Care | Payer: BLUE CROSS/BLUE SHIELD | Source: Ambulatory Visit | Attending: Gastroenterology | Admitting: Gastroenterology

## 2016-11-09 DIAGNOSIS — R768 Other specified abnormal immunological findings in serum: Secondary | ICD-10-CM

## 2017-08-21 ENCOUNTER — Other Ambulatory Visit: Payer: Self-pay | Admitting: Gastroenterology

## 2017-08-21 DIAGNOSIS — R768 Other specified abnormal immunological findings in serum: Secondary | ICD-10-CM

## 2017-09-18 ENCOUNTER — Ambulatory Visit
Admission: RE | Admit: 2017-09-18 | Discharge: 2017-09-18 | Disposition: A | Discharge status: Home / Self Care | Payer: BLUE CROSS/BLUE SHIELD | Source: Ambulatory Visit | Attending: Gastroenterology | Admitting: Gastroenterology

## 2017-09-18 DIAGNOSIS — R768 Other specified abnormal immunological findings in serum: Secondary | ICD-10-CM

## 2017-10-26 IMAGING — US US ABDOMEN COMPLETE W/ ELASTOGRAPHY
1 series · 13 of 25 positions shown · non-contrast
Comparison: None.

CLINICAL DATA: Hepatitis-C without coma



[Series 1: us abdomen complete w/ elastography · 0.19mm/px · 13 of 77 slices shown]
[im 1/77]
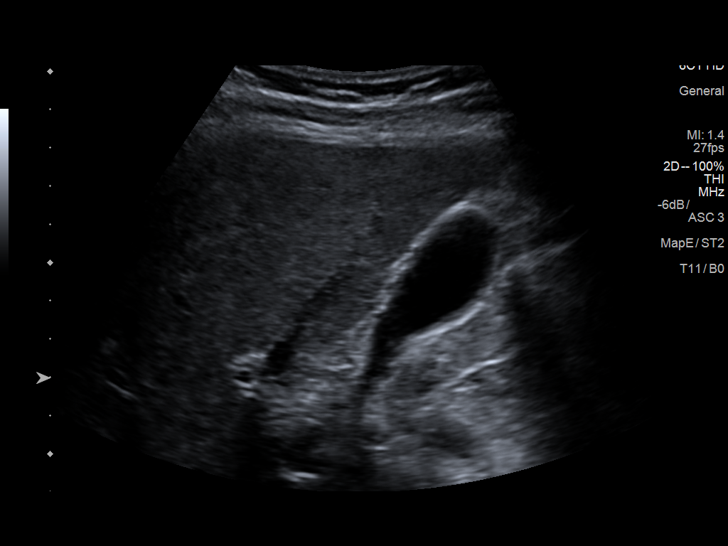
[im 7/77]
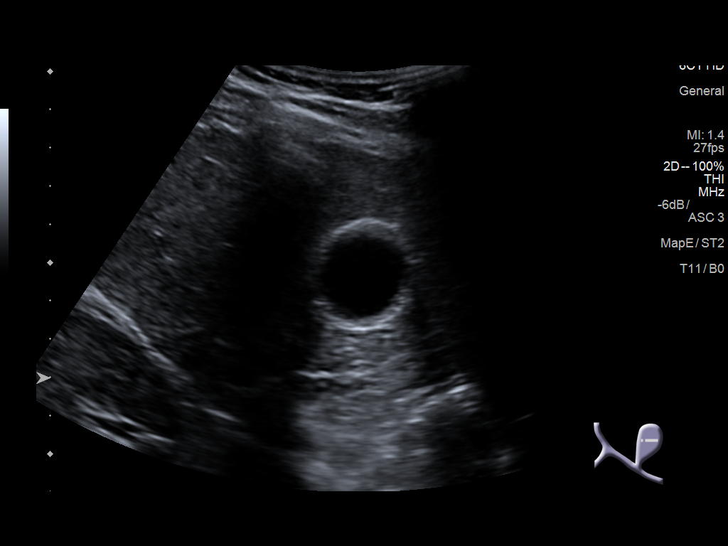
[im 13/77]
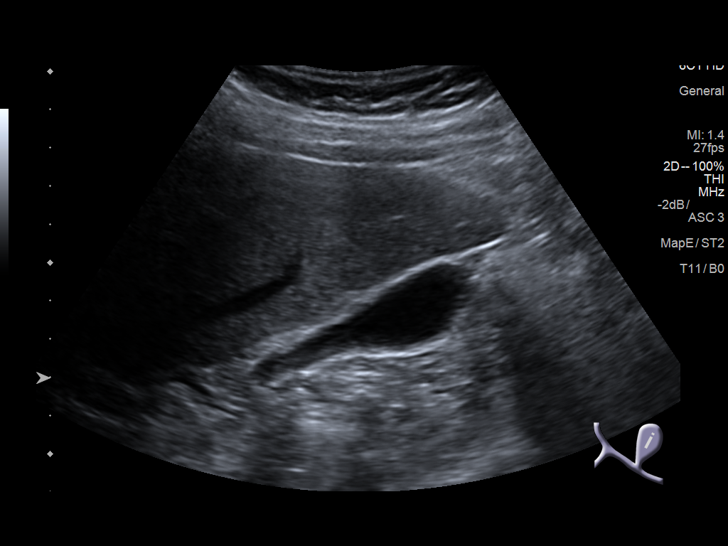
[im 20/77]
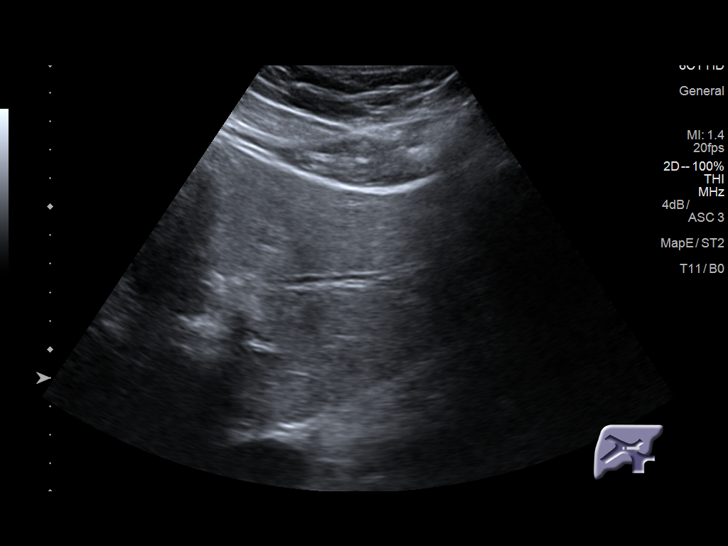
[im 26/77]
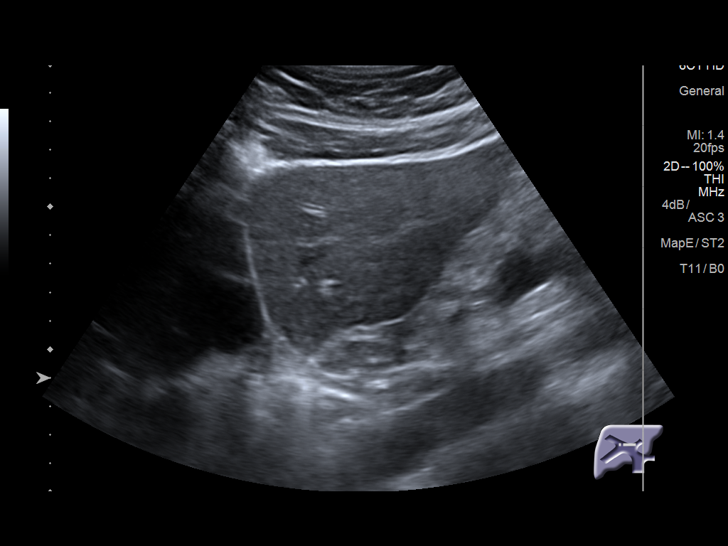
[im 32/77]
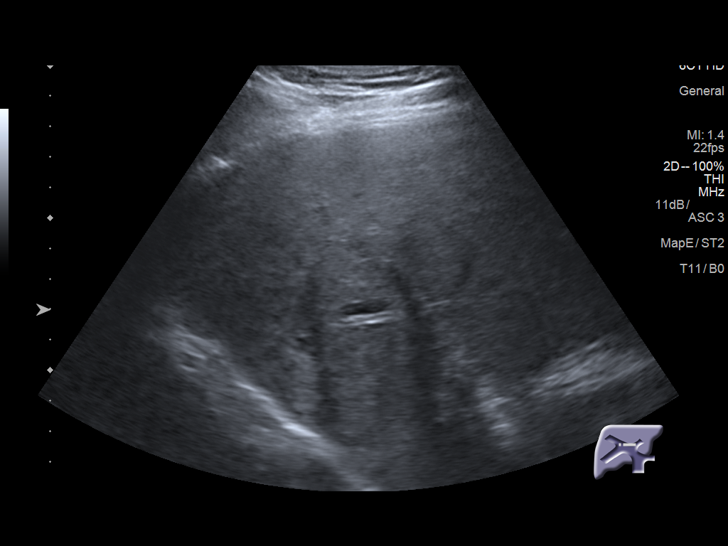
[im 39/77]
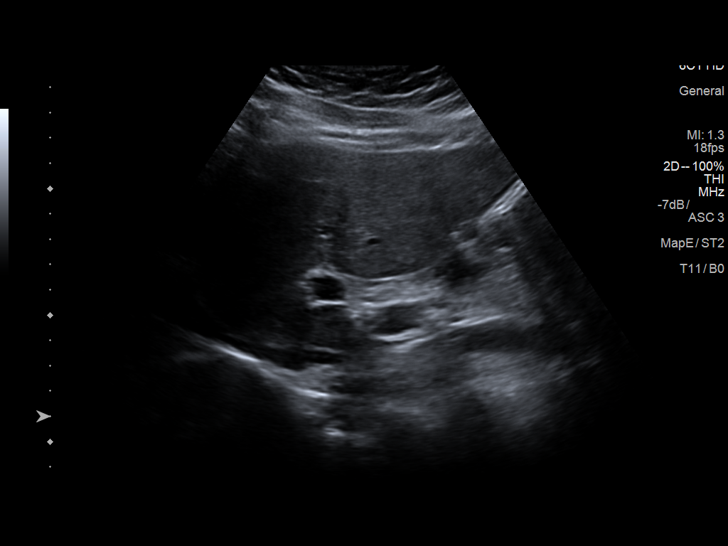
[im 45/77]
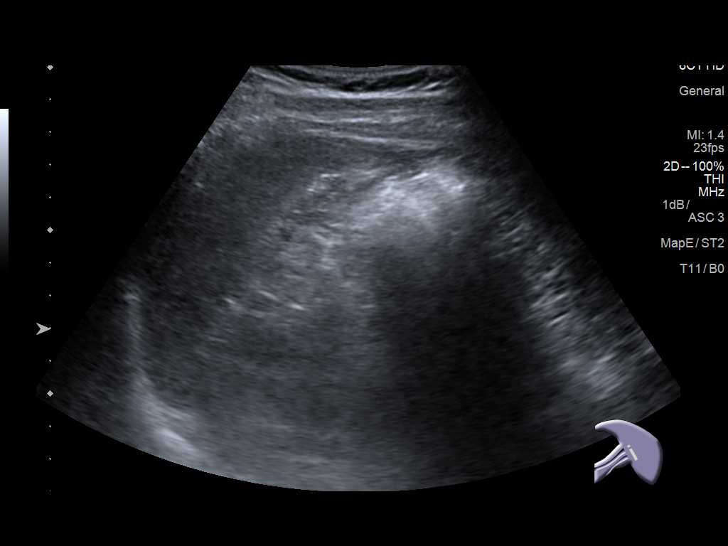
[im 51/77]
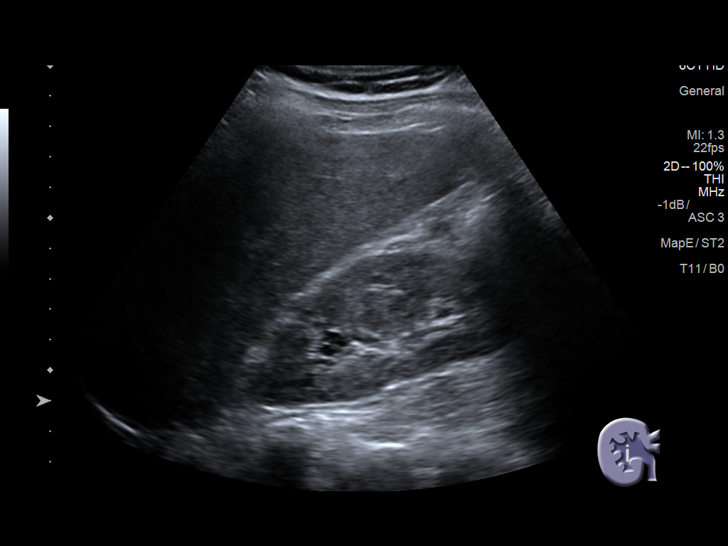
[im 58/77]
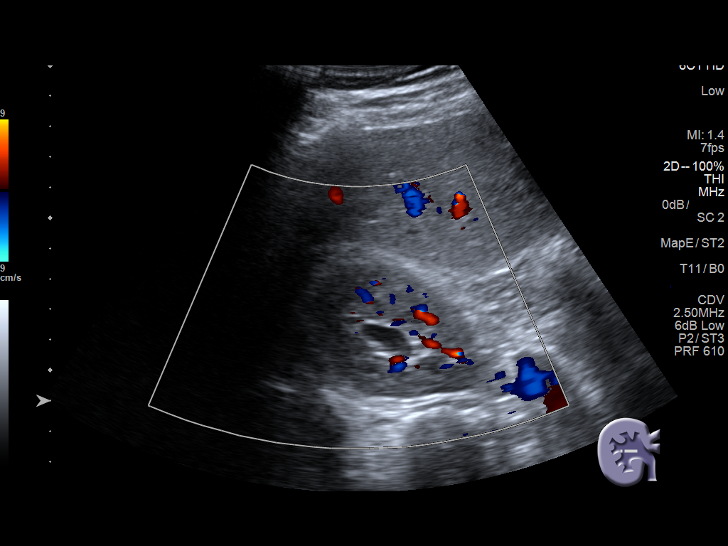
[im 64/77]
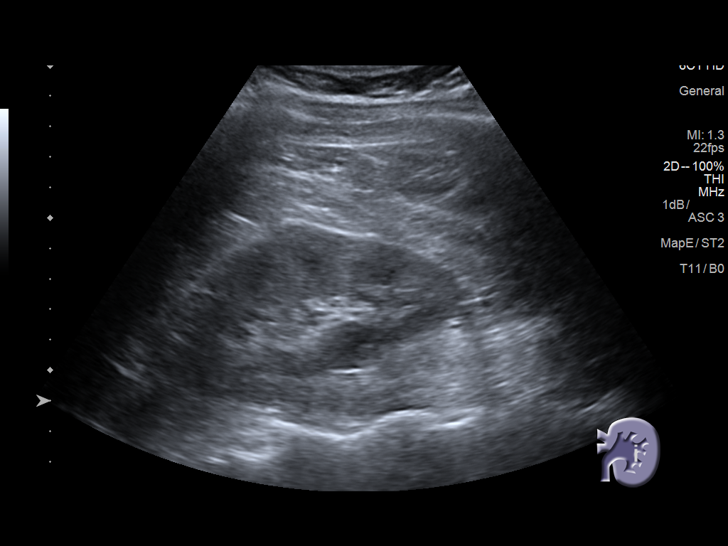
[im 70/77]
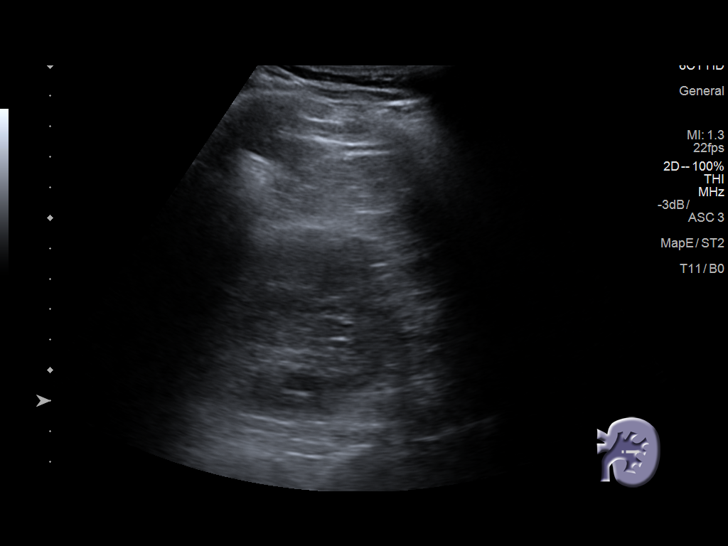
[im 77/77]
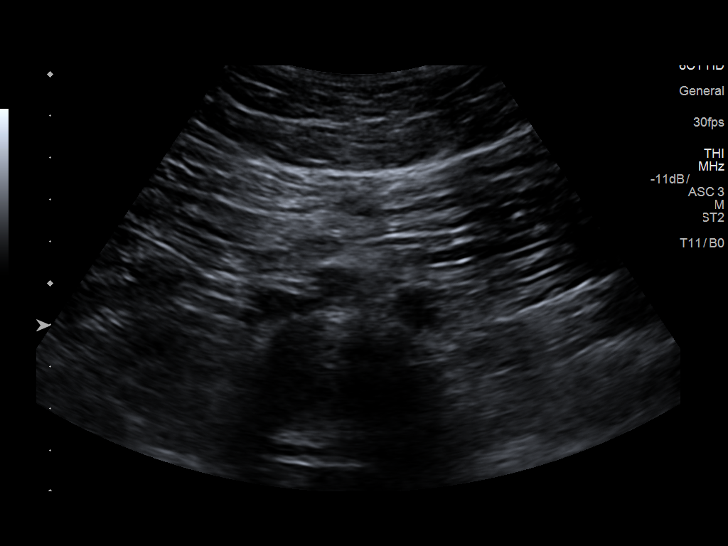

[13 of 25 positions shown; findings below may reference images not displayed]

FINDINGS: ULTRASOUND ABDOMEN

Gallbladder: No gallstones or wall thickening visualized. No
sonographic Murphy sign noted by sonographer.

Common bile duct: Diameter: 5.6 mm

Liver: No focal lesion identified. Within normal limits in
parenchymal echogenicity.

IVC: No abnormality visualized.

Pancreas: Visualized portion unremarkable.

Spleen: Size and appearance within normal limits.

Right Kidney: Length: 11.0. Echogenicity within normal limits. No
mass or hydronephrosis visualized.

Left Kidney: Length: 10.4 cm. Echogenicity within normal limits. No
mass or hydronephrosis visualized.

Abdominal aorta: Measures 2.6 cm

Other findings: None.

ULTRASOUND HEPATIC ELASTOGRAPHY

Device: Siemens Helix VTQ

Patient position:  Left lateral decubitus

Transducer 6 C1 HD

Number of measurements:  10

Hepatic Segment:  8

Median velocity:   2.76  m/sec

IQR:

IQR/Median velocity ratio

Corresponding Metavir fibrosis score:  Some F3 and F4

Risk of fibrosis: High

Limitations of exam: Difficulty following breathing instructions.

Pertinent findings noted on other imaging exams:  None

Please note that abnormal shear wave velocities may also be
identified in clinical settings other than with hepatic fibrosis,
such as: acute hepatitis, elevated right heart and central venous
pressures including use of beta blockers, Long disease
([place]), infiltrative processes such as
mastocytosis/amyloidosis/infiltrative tumor, extrahepatic
cholestasis, in the post-prandial state, and liver transplantation.
Correlation with patient history, laboratory data, and clinical
condition recommended.
IMPRESSION: 1. No acute findings.

Median hepatic shear wave velocity is calculated at 2.76 m/sec.

Corresponding Metavir fibrosis score is some F3 and F4.

Risk of fibrosis is high.

Follow-up:  Followup advised

## 2018-01-30 ENCOUNTER — Other Ambulatory Visit: Payer: Self-pay | Admitting: Gastroenterology

## 2018-01-30 ENCOUNTER — Ambulatory Visit
Admission: RE | Admit: 2018-01-30 | Discharge: 2018-01-30 | Disposition: A | Discharge status: Home / Self Care | Payer: BLUE CROSS/BLUE SHIELD | Source: Ambulatory Visit | Attending: Gastroenterology | Admitting: Gastroenterology

## 2018-01-30 DIAGNOSIS — B1910 Unspecified viral hepatitis B without hepatic coma: Secondary | ICD-10-CM

## 2018-05-23 ENCOUNTER — Emergency Department (HOSPITAL_COMMUNITY): Payer: BLUE CROSS/BLUE SHIELD

## 2018-05-23 ENCOUNTER — Emergency Department (HOSPITAL_COMMUNITY)
Admission: EM | Admit: 2018-05-23 | Discharge: 2018-05-23 | Disposition: A | Discharge status: Home / Self Care | Payer: BLUE CROSS/BLUE SHIELD | Attending: Emergency Medicine | Admitting: Emergency Medicine

## 2018-05-23 ENCOUNTER — Encounter (HOSPITAL_COMMUNITY): Payer: Self-pay | Admitting: Emergency Medicine

## 2018-05-23 ENCOUNTER — Other Ambulatory Visit: Payer: Self-pay

## 2018-05-23 DIAGNOSIS — M25532 Pain in left wrist: Secondary | ICD-10-CM | POA: Insufficient documentation

## 2018-05-23 DIAGNOSIS — Y92410 Unspecified street and highway as the place of occurrence of the external cause: Secondary | ICD-10-CM | POA: Insufficient documentation

## 2018-05-23 DIAGNOSIS — Y9389 Activity, other specified: Secondary | ICD-10-CM | POA: Insufficient documentation

## 2018-05-23 DIAGNOSIS — Y998 Other external cause status: Secondary | ICD-10-CM | POA: Insufficient documentation

## 2018-05-23 DIAGNOSIS — M542 Cervicalgia: Secondary | ICD-10-CM | POA: Insufficient documentation

## 2018-05-23 DIAGNOSIS — M25512 Pain in left shoulder: Secondary | ICD-10-CM | POA: Insufficient documentation

## 2018-05-23 DIAGNOSIS — M549 Dorsalgia, unspecified: Secondary | ICD-10-CM | POA: Insufficient documentation

## 2018-05-23 MED ORDER — IBUPROFEN 600 MG PO TABS: 600.0000 mg | ORAL_TABLET | Freq: Four times a day (QID) | ORAL | 0 refills | Status: AC | PRN

## 2018-05-23 MED ORDER — METHOCARBAMOL 750 MG PO TABS: 750.0000 mg | ORAL_TABLET | Freq: Two times a day (BID) | ORAL | 0 refills | Status: DC

## 2018-05-23 MED ORDER — ACETAMINOPHEN 500 MG PO TABS: 500.0000 mg | ORAL_TABLET | Freq: Four times a day (QID) | ORAL | 0 refills | Status: AC | PRN

## 2018-05-23 MED ORDER — IBUPROFEN 600 MG PO TABS: 600.0000 mg | ORAL_TABLET | Freq: Four times a day (QID) | ORAL | 0 refills | Status: DC | PRN

## 2018-05-23 MED ORDER — METHOCARBAMOL 750 MG PO TABS: 750.0000 mg | ORAL_TABLET | Freq: Two times a day (BID) | ORAL | 0 refills | Status: AC

## 2018-05-23 MED ORDER — ACETAMINOPHEN 500 MG PO TABS: 500.0000 mg | ORAL_TABLET | Freq: Four times a day (QID) | ORAL | 0 refills | Status: DC | PRN

## 2018-05-23 NOTE — ED Notes (Signed)
Patient verbalized understanding of discharge instructions. Opportunities for questioning and answers were provided. Armband removed by staff. Patient removed from ED.   
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
## 2018-08-13 ENCOUNTER — Other Ambulatory Visit: Payer: Self-pay | Admitting: Gastroenterology

## 2018-08-13 DIAGNOSIS — R768 Other specified abnormal immunological findings in serum: Secondary | ICD-10-CM

## 2019-06-15 ENCOUNTER — Ambulatory Visit: Payer: BLUE CROSS/BLUE SHIELD | Attending: Internal Medicine

## 2019-06-15 DIAGNOSIS — Z23 Encounter for immunization: Secondary | ICD-10-CM

## 2019-06-15 NOTE — Progress Notes (Signed)
   Covid-19 Vaccination Clinic  Name:  Bryan Marsh    MRN: [id number] DOB: 01-Jul-1970  06/15/2019  [name] was observed post Covid-19 immunization for 15 minutes without incident. He was provided with Vaccine Information Sheet and instruction to access the V-Safe system.   [name] was instructed to call 911 with any severe reactions post vaccine: Marland Kitchen Difficulty breathing  . Swelling of face and throat  . A fast heartbeat  . A bad rash all over body  . Dizziness and weakness   Immunizations Administered    Name Date Dose VIS Date Route   Pfizer COVID-19 Vaccine 06/15/2019 12:43 PM 0.3 mL 03/06/2019 Intramuscular   Manufacturer: [employer], [employer]   Lot: [id number]   NDC: [address]-3

## 2019-07-07 ENCOUNTER — Ambulatory Visit: Payer: BLUE CROSS/BLUE SHIELD | Attending: Internal Medicine

## 2019-07-07 DIAGNOSIS — Z23 Encounter for immunization: Secondary | ICD-10-CM

## 2019-07-07 NOTE — Progress Notes (Signed)
   Covid-19 Vaccination Clinic  Name:  Nichole Keltner    MRN: [id number] DOB: [birth year]  07/07/2019  Mr. Bowdish was observed post Covid-19 immunization for 15 minutes without incident. He was provided with Vaccine Information Sheet and instruction to access the V-Safe system.   Mr. Madewell was instructed to call 911 with any severe reactions post vaccine: Marland Kitchen Difficulty breathing  . Swelling of face and throat  . A fast heartbeat  . A bad rash all over body  . Dizziness and weakness   Immunizations Administered    Name Date Dose VIS Date Route   Pfizer COVID-19 Vaccine 07/07/2019 11:59 AM 0.3 mL 03/06/2019 Intramuscular   Manufacturer: [employer], [employer]   Lot: [id number]   NDC: [address]-0

## 2019-08-29 ENCOUNTER — Other Ambulatory Visit: Payer: Self-pay

## 2019-08-29 ENCOUNTER — Ambulatory Visit (HOSPITAL_COMMUNITY)
Admission: EM | Admit: 2019-08-29 | Discharge: 2019-08-29 | Disposition: A | Discharge status: Home / Self Care | Payer: BLUE CROSS/BLUE SHIELD | Attending: Family Medicine | Admitting: Family Medicine

## 2019-08-29 ENCOUNTER — Encounter (HOSPITAL_COMMUNITY): Payer: Self-pay

## 2019-08-29 DIAGNOSIS — S61011A Laceration without foreign body of right thumb without damage to nail, initial encounter: Secondary | ICD-10-CM | POA: Diagnosis not present

## 2019-08-29 HISTORY — DX: Essential (primary) hypertension: I10
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
## 2019-09-29 ENCOUNTER — Ambulatory Visit (HOSPITAL_COMMUNITY)
Admission: EM | Admit: 2019-09-29 | Discharge: 2019-09-29 | Disposition: A | Discharge status: Home / Self Care | Payer: BLUE CROSS/BLUE SHIELD

## 2019-09-29 ENCOUNTER — Other Ambulatory Visit: Payer: Self-pay

## 2019-09-29 ENCOUNTER — Encounter (HOSPITAL_COMMUNITY): Payer: Self-pay | Admitting: Emergency Medicine

## 2019-09-29 NOTE — ED Triage Notes (Signed)
Pt arrives for suture removal. Sutures were placed 1 month ago. PT did not return in 10 day window because he felt like sutures needed more time. Wound is closed, no erythema, no drainage. 4 sutures removed without difficulty.

## 2020-04-26 ENCOUNTER — Other Ambulatory Visit: Payer: Self-pay | Admitting: Internal Medicine

## 2020-04-27 LAB — COMPLETE METABOLIC PANEL WITH GFR
AG Ratio: 1.6 (calc) (ref 1.0–2.5)
ALT: 25 U/L (ref 9–46)
AST: 17 U/L (ref 10–35)
Albumin: 4.2 g/dL (ref 3.6–5.1)
Alkaline phosphatase (APISO): 57 U/L (ref 35–144)
BUN: 14 mg/dL (ref 7–25)
CO2: 26 mmol/L (ref 20–32)
Calcium: 9.2 mg/dL (ref 8.6–10.3)
Chloride: 104 mmol/L (ref 98–110)
Creat: 1.04 mg/dL (ref 0.70–1.33)
GFR, Est African American: 96 mL/min/{1.73_m2} (ref >=60)
GFR, Est Non African American: 83 mL/min/{1.73_m2} (ref >=60)
Globulin: 2.6 g/dL (calc) (ref 1.9–3.7)
Glucose, Bld: 72 mg/dL (ref 65–99)
Potassium: 4.9 mmol/L (ref 3.5–5.3)
Sodium: 139 mmol/L (ref 135–146)
Total Bilirubin: 0.4 mg/dL (ref 0.2–1.2)
Total Protein: 6.8 g/dL (ref 6.1–8.1)

## 2020-04-27 LAB — CBC
HCT: 40.8 % (ref 38.5–50.0)
Hemoglobin: 14 g/dL (ref 13.2–17.1)
MCH: 31.5 pg (ref 27.0–33.0)
MCHC: 34.3 g/dL (ref 32.0–36.0)
MCV: 91.9 fL (ref 80.0–100.0)
MPV: 13.2 fL — ABNORMAL HIGH (ref 7.5–12.5)
Platelets: 178 10*3/uL (ref 140–400)
RBC: 4.44 10*6/uL (ref 4.20–5.80)
RDW: 13.6 % (ref 11.0–15.0)
WBC: 6.6 10*3/uL (ref 3.8–10.8)

## 2020-04-27 LAB — TSH: TSH: 1.61 mIU/L (ref 0.40–4.50)

## 2020-04-27 LAB — LIPID PANEL
Cholesterol: 180 mg/dL (ref <200)
HDL: 30 mg/dL — ABNORMAL LOW (ref >=40)
LDL Cholesterol (Calc): 125 mg/dL (calc) — ABNORMAL HIGH
Non-HDL Cholesterol (Calc): 150 mg/dL (calc) — ABNORMAL HIGH (ref <130)
Total CHOL/HDL Ratio: 6 (calc) — ABNORMAL HIGH (ref <5.0)
Triglycerides: 141 mg/dL (ref <150)

## 2020-04-27 LAB — PSA: PSA: 0.57 ng/mL (ref <=4.0)

## 2020-04-27 LAB — VITAMIN D 25 HYDROXY (VIT D DEFICIENCY, FRACTURES): Vit D, 25-Hydroxy: 20 ng/mL — ABNORMAL LOW (ref 30–100)

## 2022-04-11 ENCOUNTER — Other Ambulatory Visit: Payer: Self-pay | Admitting: Internal Medicine

## 2022-04-12 LAB — SARS-COV-2 RNA,(COVID-19) QUALITATIVE NAAT: SARS CoV2 RNA: DETECTED — AB

## 2022-04-12 LAB — COMPLETE METABOLIC PANEL WITH GFR
AG Ratio: 1.3 (calc) (ref 1.0–2.5)
ALT: 22 U/L (ref 9–46)
AST: 16 U/L (ref 10–35)
Albumin: 4.6 g/dL (ref 3.6–5.1)
Alkaline phosphatase (APISO): 55 U/L (ref 35–144)
BUN: 13 mg/dL (ref 7–25)
CO2: 20 mmol/L (ref 20–32)
Calcium: 10.4 mg/dL — ABNORMAL HIGH (ref 8.6–10.3)
Chloride: 110 mmol/L (ref 98–110)
Creat: 1.2 mg/dL (ref 0.70–1.30)
Globulin: 3.6 g/dL (calc) (ref 1.9–3.7)
Glucose, Bld: 127 mg/dL — ABNORMAL HIGH (ref 65–99)
Potassium: 4.3 mmol/L (ref 3.5–5.3)
Sodium: 148 mmol/L — ABNORMAL HIGH (ref 135–146)
Total Bilirubin: 0.4 mg/dL (ref 0.2–1.2)
Total Protein: 8.2 g/dL — ABNORMAL HIGH (ref 6.1–8.1)
eGFR: 72 mL/min/{1.73_m2} (ref >=60)

## 2022-04-12 LAB — INFLUENZA A AND B AG, IMMUNOASSAY
INFLUENZA A ANTIGEN: NOT DETECTED
INFLUENZA B ANTIGEN: NOT DETECTED
MICRO NUMBER:: 14440681
SPECIMEN QUALITY:: ADEQUATE

## 2022-04-12 LAB — CBC
HCT: 40 % (ref 38.5–50.0)
Hemoglobin: 13.9 g/dL (ref 13.2–17.1)
MCH: 31.5 pg (ref 27.0–33.0)
MCHC: 34.8 g/dL (ref 32.0–36.0)
MCV: 90.7 fL (ref 80.0–100.0)
MPV: 12.7 fL — ABNORMAL HIGH (ref 7.5–12.5)
Platelets: 180 10*3/uL (ref 140–400)
RBC: 4.41 10*6/uL (ref 4.20–5.80)
RDW: 13.5 % (ref 11.0–15.0)
WBC: 8.2 10*3/uL (ref 3.8–10.8)

## 2022-04-12 LAB — TSH: TSH: 0.26 mIU/L — ABNORMAL LOW (ref 0.40–4.50)

## 2022-04-12 LAB — LIPID PANEL
Cholesterol: 197 mg/dL (ref <200)
HDL: 41 mg/dL (ref >=40)
LDL Cholesterol (Calc): 136 mg/dL (calc) — ABNORMAL HIGH
Non-HDL Cholesterol (Calc): 156 mg/dL (calc) — ABNORMAL HIGH (ref <130)
Total CHOL/HDL Ratio: 4.8 (calc) (ref <5.0)
Triglycerides: 95 mg/dL (ref <150)

## 2022-04-12 LAB — PSA: PSA: 0.42 ng/mL (ref <=4.00)

## 2022-06-12 ENCOUNTER — Other Ambulatory Visit: Payer: Self-pay | Admitting: Internal Medicine

## 2022-06-13 LAB — LIPID PANEL
Cholesterol: 189 mg/dL (ref <200)
HDL: 34 mg/dL — ABNORMAL LOW (ref >=40)
LDL Cholesterol (Calc): 124 mg/dL (calc) — ABNORMAL HIGH
Non-HDL Cholesterol (Calc): 155 mg/dL (calc) — ABNORMAL HIGH (ref <130)
Total CHOL/HDL Ratio: 5.6 (calc) — ABNORMAL HIGH (ref <5.0)
Triglycerides: 190 mg/dL — ABNORMAL HIGH (ref <150)

## 2022-06-13 LAB — COMPLETE METABOLIC PANEL WITH GFR
AG Ratio: 1.3 (calc) (ref 1.0–2.5)
ALT: 27 U/L (ref 9–46)
AST: 25 U/L (ref 10–35)
Albumin: 4 g/dL (ref 3.6–5.1)
Alkaline phosphatase (APISO): 55 U/L (ref 35–144)
BUN: 15 mg/dL (ref 7–25)
CO2: 21 mmol/L (ref 20–32)
Calcium: 9.2 mg/dL (ref 8.6–10.3)
Chloride: 106 mmol/L (ref 98–110)
Creat: 1.06 mg/dL (ref 0.70–1.30)
Globulin: 3 g/dL (calc) (ref 1.9–3.7)
Glucose, Bld: 111 mg/dL — ABNORMAL HIGH (ref 65–99)
Potassium: 4.7 mmol/L (ref 3.5–5.3)
Sodium: 138 mmol/L (ref 135–146)
Total Bilirubin: 0.3 mg/dL (ref 0.2–1.2)
Total Protein: 7 g/dL (ref 6.1–8.1)
eGFR: 84 mL/min/{1.73_m2} (ref >=60)

## 2022-06-13 LAB — VITAMIN D 25 HYDROXY (VIT D DEFICIENCY, FRACTURES): Vit D, 25-Hydroxy: 27 ng/mL — ABNORMAL LOW (ref 30–100)

## 2022-06-13 LAB — TSH: TSH: 1.07 mIU/L (ref 0.40–4.50)

## 2022-06-13 LAB — CBC
HCT: 40 % (ref 38.5–50.0)
Hemoglobin: 13.4 g/dL (ref 13.2–17.1)
MCH: 30.6 pg (ref 27.0–33.0)
MCHC: 33.5 g/dL (ref 32.0–36.0)
MCV: 91.3 fL (ref 80.0–100.0)
MPV: 13.1 fL — ABNORMAL HIGH (ref 7.5–12.5)
Platelets: 174 10*3/uL (ref 140–400)
RBC: 4.38 10*6/uL (ref 4.20–5.80)
RDW: 13.7 % (ref 11.0–15.0)
WBC: 5.4 10*3/uL (ref 3.8–10.8)

## 2022-06-13 LAB — PSA: PSA: 0.32 ng/mL (ref <=4.00)
